# Patient Record
Sex: Female | Born: 2000 | Race: Black or African American | Hispanic: No | Marital: Single | Smoking: Never smoker
Health system: Southern US, Community
[De-identification: ages and names within clinical notes are randomized; demographics above are authoritative.]

## PROBLEM LIST (undated history)

## (undated) DIAGNOSIS — J45909 Unspecified asthma, uncomplicated: Secondary | ICD-10-CM

## (undated) HISTORY — PX: TONSILLECTOMY: SUR1361

---
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
## 2022-05-08 ENCOUNTER — Other Ambulatory Visit: Payer: Self-pay

## 2022-05-08 ENCOUNTER — Encounter (HOSPITAL_BASED_OUTPATIENT_CLINIC_OR_DEPARTMENT_OTHER): Payer: Self-pay | Admitting: Emergency Medicine

## 2022-05-08 ENCOUNTER — Emergency Department (HOSPITAL_BASED_OUTPATIENT_CLINIC_OR_DEPARTMENT_OTHER)
Admission: EM | Admit: 2022-05-08 | Discharge: 2022-05-08 | Disposition: A | Discharge status: Home / Self Care | Payer: Medicaid Other | Attending: Emergency Medicine | Admitting: Emergency Medicine

## 2022-05-08 DIAGNOSIS — O99112 Other diseases of the blood and blood-forming organs and certain disorders involving the immune mechanism complicating pregnancy, second trimester: Secondary | ICD-10-CM | POA: Insufficient documentation

## 2022-05-08 DIAGNOSIS — O219 Vomiting of pregnancy, unspecified: Secondary | ICD-10-CM

## 2022-05-08 DIAGNOSIS — D72829 Elevated white blood cell count, unspecified: Secondary | ICD-10-CM | POA: Insufficient documentation

## 2022-05-08 DIAGNOSIS — Z3A17 17 weeks gestation of pregnancy: Secondary | ICD-10-CM | POA: Insufficient documentation

## 2022-05-08 DIAGNOSIS — K226 Gastro-esophageal laceration-hemorrhage syndrome: Secondary | ICD-10-CM | POA: Diagnosis not present

## 2022-05-08 DIAGNOSIS — O99612 Diseases of the digestive system complicating pregnancy, second trimester: Secondary | ICD-10-CM | POA: Insufficient documentation

## 2022-05-08 LAB — URINALYSIS, ROUTINE W REFLEX MICROSCOPIC
Glucose, UA: NEGATIVE mg/dL
Hgb urine dipstick: NEGATIVE
Ketones, ur: >=80 mg/dL — AB
Leukocytes,Ua: NEGATIVE
Nitrite: NEGATIVE
Protein, ur: 100 mg/dL — AB
Specific Gravity, Urine: >=1.03 (ref 1.005–1.030)
pH: 6 (ref 5.0–8.0)

## 2022-05-08 LAB — CBC WITH DIFFERENTIAL/PLATELET
Abs Immature Granulocytes: 0.15 10*3/uL — ABNORMAL HIGH (ref 0.00–0.07)
Basophils Absolute: 0.1 10*3/uL (ref 0.0–0.1)
Basophils Relative: 0 %
Eosinophils Absolute: 0 10*3/uL (ref 0.0–0.5)
Eosinophils Relative: 0 %
HCT: 38.4 % (ref 36.0–46.0)
Hemoglobin: 12.1 g/dL (ref 12.0–15.0)
Immature Granulocytes: 1 %
Lymphocytes Relative: 6 %
Lymphs Abs: 1.3 10*3/uL (ref 0.7–4.0)
MCH: 23.3 pg — ABNORMAL LOW (ref 26.0–34.0)
MCHC: 31.5 g/dL (ref 30.0–36.0)
MCV: 73.8 fL — ABNORMAL LOW (ref 80.0–100.0)
Monocytes Absolute: 1 10*3/uL (ref 0.1–1.0)
Monocytes Relative: 4 %
Neutro Abs: 19.6 10*3/uL — ABNORMAL HIGH (ref 1.7–7.7)
Neutrophils Relative %: 89 %
Platelets: 361 10*3/uL (ref 150–400)
RBC: 5.2 MIL/uL — ABNORMAL HIGH (ref 3.87–5.11)
RDW: 16.1 % — ABNORMAL HIGH (ref 11.5–15.5)
WBC: 22 10*3/uL — ABNORMAL HIGH (ref 4.0–10.5)
nRBC: 0 % (ref 0.0–0.2)

## 2022-05-08 LAB — COMPREHENSIVE METABOLIC PANEL
ALT: 15 U/L (ref 0–44)
AST: 14 U/L — ABNORMAL LOW (ref 15–41)
Albumin: 3.5 g/dL (ref 3.5–5.0)
Alkaline Phosphatase: 58 U/L (ref 38–126)
Anion gap: 11 (ref 5–15)
BUN: 7 mg/dL (ref 6–20)
CO2: 22 mmol/L (ref 22–32)
Calcium: 9.2 mg/dL (ref 8.9–10.3)
Chloride: 105 mmol/L (ref 98–111)
Creatinine, Ser: 0.68 mg/dL (ref 0.44–1.00)
GFR, Estimated: >60 mL/min (ref >60)
Glucose, Bld: 105 mg/dL — ABNORMAL HIGH (ref 70–99)
Potassium: 3.4 mmol/L — ABNORMAL LOW (ref 3.5–5.1)
Sodium: 138 mmol/L (ref 135–145)
Total Bilirubin: 0.7 mg/dL (ref 0.3–1.2)
Total Protein: 7.3 g/dL (ref 6.5–8.1)

## 2022-05-08 LAB — URINALYSIS, MICROSCOPIC (REFLEX)

## 2022-05-08 LAB — LIPASE, BLOOD: Lipase: 31 U/L (ref 11–51)

## 2022-05-08 MED ORDER — ALUM & MAG HYDROXIDE-SIMETH 200-200-20 MG/5ML PO SUSP
30.0000 mL | Freq: Once | ORAL | Status: AC
  Administered 2022-05-08: 30 mL via ORAL
  Filled 2022-05-08: qty 30

## 2022-05-08 MED ORDER — ONDANSETRON HCL 4 MG/2ML IJ SOLN
4.0000 mg | Freq: Once | INTRAMUSCULAR | Status: AC
Start: 2022-05-08 — End: 2022-05-08
  Administered 2022-05-08: 4 mg via INTRAVENOUS
  Filled 2022-05-08: qty 2

## 2022-05-08 MED ORDER — LACTATED RINGERS IV BOLUS
1000.0000 mL | Freq: Once | INTRAVENOUS | Status: AC
  Administered 2022-05-08: 1000 mL via INTRAVENOUS
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
## 2022-05-08 NOTE — ED Provider Notes (Signed)
[facility] EMERGENCY DEPARTMENT  Provider Note  CSN: [id number] Arrival date & time: 05/08/22 0005  History Chief Complaint  Patient presents with   Nausea   Emesis    Brittney Griffith is a 21 y.o. female who is G1 approx [redacted]wks pregnant with persistent N/V during this pregnancy. Reported seeing some streaks of blood in her emesis this afternoon and discomfort in her chest. Advised by Ob to come to the ED.    Home Medications Prior to Admission medications   Medication Sig Start Date End Date Taking? Authorizing Provider  diphenhydrAMINE (BENADRYL) 25 MG tablet Take 25 mg by mouth every 6 (six) hours as needed.    [provider]     Allergies    Patient has no known allergies.   Review of Systems   Review of Systems Please see HPI for pertinent positives and negatives  Physical Exam BP 116/66   Pulse 68   Temp 98.9 F (37.2 C)   Resp 18   Ht 5\' 6"  (1.676 m)   Wt 102.1 kg   SpO2 100%   BMI 36.32 kg/m   Physical Exam Vitals and nursing note reviewed.  Constitutional:      Appearance: Normal appearance.  HENT:     Head: Normocephalic and atraumatic.     Nose: Nose normal.     Mouth/Throat:     Mouth: Mucous membranes are moist.  Eyes:     Extraocular Movements: Extraocular movements intact.     Conjunctiva/sclera: Conjunctivae normal.  Cardiovascular:     Rate and Rhythm: Normal rate.     Heart sounds: Normal heart sounds.  Pulmonary:     Effort: Pulmonary effort is normal.     Breath sounds: Normal breath sounds.  Abdominal:     General: Abdomen is flat.     Palpations: Abdomen is soft.     Tenderness: There is no abdominal tenderness. There is no guarding.  Musculoskeletal:        General: No swelling. Normal range of motion.     Cervical back: Neck supple.  Skin:    General: Skin is warm and dry.  Neurological:     General: No focal deficit present.     Mental Status: She is alert.  Psychiatric:        Mood and Affect:  Mood normal.     ED Results / Procedures / Treatments   EKG None  Procedures Procedures  Medications Ordered in the ED Medications  lactated ringers bolus 1,000 mL (0 mLs Intravenous Stopped 05/08/22 0315)  ondansetron (ZOFRAN) injection 4 mg (4 mg Intravenous Given 05/08/22 0223)  lactated ringers bolus 1,000 mL (1,000 mLs Intravenous New Bag/Given 05/08/22 0343)  alum & mag hydroxide-simeth (MAALOX/MYLANTA) 200-200-20 MG/5ML suspension 30 mL (30 mLs Oral Given 05/08/22 0409)    Initial Impression and Plan  Patient here with vomiting during pregnancy. Exam is benign, vitals are normal. She likely is having some mallory-weiss tears given her reported retching. No gross or large volume hematemesis in the ED. Will check labs and give IVF/Antiemetics.   ED Course   Clinical Course as of 05/08/22 0416  Wed May 08, 2022  0254 CBC with leukocytosis, similar to previous visit at [facility]. Likely due to pregnancy.  [CS]  0332 CMP is unremarkable. UA is concentrated, with ketones consistent with history of vomiting. Will give additional liter of fluids.  [CS]  [facility] Patient feeling better, no longer vomiting. Still having some discomfort with swallowing and  asking for GI cocktail. Otherwise she is tolerating PO fluids and can be discharged when IVF are complete. Recommend she call Ob/Gyn in the AM to discuss her home antiemetic regimen.  [CS]    Clinical Course User Index [CS] Pollyann Savoy, MD     MDM Rules/Calculators/A&P Medical Decision Making Given presenting complaint, I considered that admission might be necessary. After review of results from ED lab and/or imaging studies, admission to the hospital is not indicated at this time.     Problems Addressed: Mallory-Weiss tear: acute illness or injury Vomiting during pregnancy: acute illness or injury  Amount and/or Complexity of Data Reviewed Labs: ordered. Decision-making details documented in ED Course.  Risk OTC  drugs. Prescription drug management. Decision regarding hospitalization.    Final Clinical Impression(s) / ED Diagnoses Final diagnoses:  Vomiting during pregnancy  Mallory-Weiss tear    Rx / DC Orders ED Discharge Orders     None        Pollyann Savoy, MD 05/08/22 [phone]
